# Patient Record
Sex: Female | Born: 1970 | Race: White | Hispanic: No | Marital: Single | Smoking: Current every day smoker
Health system: Southern US, Community
[De-identification: ages and names within clinical notes are randomized; demographics above are authoritative.]

## PROBLEM LIST (undated history)

## (undated) DIAGNOSIS — M797 Fibromyalgia: Secondary | ICD-10-CM

## (undated) DIAGNOSIS — K7460 Unspecified cirrhosis of liver: Secondary | ICD-10-CM

---

## 2003-07-26 ENCOUNTER — Emergency Department (HOSPITAL_COMMUNITY): Admission: EM | Admit: 2003-07-26 | Discharge: 2003-07-26 | Payer: Self-pay | Admitting: Emergency Medicine

## 2004-03-30 ENCOUNTER — Emergency Department: Payer: Self-pay | Admitting: Emergency Medicine

## 2004-11-11 ENCOUNTER — Observation Stay: Payer: Self-pay

## 2004-12-11 ENCOUNTER — Inpatient Hospital Stay: Payer: Self-pay

## 2005-03-15 ENCOUNTER — Emergency Department: Payer: Self-pay | Admitting: Emergency Medicine

## 2005-03-16 ENCOUNTER — Emergency Department: Payer: Self-pay | Admitting: Internal Medicine

## 2005-05-02 ENCOUNTER — Emergency Department: Payer: Self-pay | Admitting: Emergency Medicine

## 2005-11-03 ENCOUNTER — Emergency Department: Payer: Self-pay | Admitting: Emergency Medicine

## 2005-11-28 ENCOUNTER — Emergency Department: Payer: Self-pay | Admitting: Emergency Medicine

## 2006-02-10 ENCOUNTER — Emergency Department: Payer: Self-pay | Admitting: Emergency Medicine

## 2006-04-06 ENCOUNTER — Ambulatory Visit: Payer: Self-pay | Admitting: General Surgery

## 2007-05-31 ENCOUNTER — Emergency Department: Payer: Self-pay | Admitting: Internal Medicine

## 2008-01-10 ENCOUNTER — Emergency Department: Payer: Self-pay | Admitting: Emergency Medicine

## 2008-03-21 ENCOUNTER — Emergency Department: Payer: Self-pay | Admitting: Emergency Medicine

## 2008-05-29 ENCOUNTER — Inpatient Hospital Stay: Payer: Self-pay | Admitting: Internal Medicine

## 2008-06-17 ENCOUNTER — Ambulatory Visit: Payer: Self-pay | Admitting: Internal Medicine

## 2008-07-02 ENCOUNTER — Inpatient Hospital Stay: Payer: Self-pay | Admitting: Internal Medicine

## 2008-08-17 ENCOUNTER — Ambulatory Visit: Payer: Self-pay | Admitting: Internal Medicine

## 2008-08-24 ENCOUNTER — Inpatient Hospital Stay: Payer: Self-pay | Admitting: Internal Medicine

## 2008-11-05 ENCOUNTER — Inpatient Hospital Stay: Payer: Self-pay | Admitting: Internal Medicine

## 2008-12-11 ENCOUNTER — Emergency Department: Payer: Self-pay | Admitting: Emergency Medicine

## 2009-04-23 ENCOUNTER — Ambulatory Visit: Payer: Self-pay | Admitting: Gastroenterology

## 2009-09-04 ENCOUNTER — Emergency Department: Payer: Self-pay | Admitting: Emergency Medicine

## 2009-09-15 ENCOUNTER — Emergency Department: Payer: Self-pay | Admitting: Emergency Medicine

## 2010-11-27 ENCOUNTER — Ambulatory Visit: Payer: Self-pay

## 2012-01-06 ENCOUNTER — Ambulatory Visit: Payer: Self-pay | Admitting: Family Medicine

## 2012-03-21 ENCOUNTER — Ambulatory Visit: Payer: Self-pay | Admitting: Pain Medicine

## 2015-08-14 DIAGNOSIS — K219 Gastro-esophageal reflux disease without esophagitis: Secondary | ICD-10-CM | POA: Insufficient documentation

## 2015-08-14 DIAGNOSIS — J301 Allergic rhinitis due to pollen: Secondary | ICD-10-CM | POA: Insufficient documentation

## 2015-12-01 DIAGNOSIS — Z79891 Long term (current) use of opiate analgesic: Secondary | ICD-10-CM | POA: Insufficient documentation

## 2016-03-05 DIAGNOSIS — R7989 Other specified abnormal findings of blood chemistry: Secondary | ICD-10-CM | POA: Insufficient documentation

## 2017-08-09 ENCOUNTER — Other Ambulatory Visit: Payer: Self-pay | Admitting: Family Medicine

## 2017-08-09 DIAGNOSIS — N6452 Nipple discharge: Secondary | ICD-10-CM

## 2017-08-12 ENCOUNTER — Other Ambulatory Visit: Payer: Self-pay | Admitting: Family Medicine

## 2017-08-12 DIAGNOSIS — N6452 Nipple discharge: Secondary | ICD-10-CM

## 2017-09-19 ENCOUNTER — Ambulatory Visit
Admission: RE | Admit: 2017-09-19 | Discharge: 2017-09-19 | Disposition: A | Discharge status: Home / Self Care | Payer: Medicaid Other | Source: Ambulatory Visit | Attending: Family Medicine | Admitting: Family Medicine

## 2017-09-19 DIAGNOSIS — N6452 Nipple discharge: Secondary | ICD-10-CM

## 2018-03-03 ENCOUNTER — Other Ambulatory Visit: Payer: Self-pay

## 2018-03-03 ENCOUNTER — Emergency Department
Admission: EM | Admit: 2018-03-03 | Discharge: 2018-03-03 | Disposition: A | Discharge status: Home / Self Care | Payer: Medicaid Other | Attending: Student in an Organized Health Care Education/Training Program | Admitting: Student in an Organized Health Care Education/Training Program

## 2018-03-03 ENCOUNTER — Encounter: Payer: Self-pay | Admitting: Emergency Medicine

## 2018-03-03 DIAGNOSIS — F17210 Nicotine dependence, cigarettes, uncomplicated: Secondary | ICD-10-CM | POA: Insufficient documentation

## 2018-03-03 DIAGNOSIS — B86 Scabies: Secondary | ICD-10-CM | POA: Insufficient documentation

## 2018-03-03 DIAGNOSIS — R21 Rash and other nonspecific skin eruption: Secondary | ICD-10-CM | POA: Diagnosis present

## 2018-03-03 HISTORY — DX: Fibromyalgia: M79.7

## 2018-03-03 HISTORY — DX: Unspecified cirrhosis of liver: K74.60

## 2018-03-03 MED ORDER — HYDROXYZINE PAMOATE 50 MG PO CAPS: 50.0000 mg | ORAL_CAPSULE | Freq: Three times a day (TID) | ORAL | 0 refills | Status: DC | PRN

## 2018-03-03 MED ORDER — PERMETHRIN 5 % EX CREA: 1.0000 "application " | TOPICAL_CREAM | Freq: Once | CUTANEOUS | 0 refills | Status: AC

## 2018-03-03 MED ORDER — IVERMECTIN 3 MG PO TABS: 200.0000 ug/kg | ORAL_TABLET | ORAL | 0 refills | Status: DC

## 2018-03-03 NOTE — ED Triage Notes (Signed)
Pt to ED from home c/o rash to entire body x1-2 weeks ago.  [facility] and [facility] for "body lice" and treated with lice shampoo.  States "could see bugs coming off of me".  Pt with circular wounds, non draining.

## 2018-03-03 NOTE — ED Notes (Signed)
FIRST NURSE NOTE:  Pt seen previously at urgent care, pt dx with "body lice" and states she has been treated. Pt reports itching continues.

## 2018-03-03 NOTE — ED Notes (Signed)
Pt very upset with this RN. Pt states "they are in the scabs, I pull them out, it's not lice". This Rn confirming with pt that "nothing is crawling" on her, pt very upset over assessment. Pt states "no one is listening to me". Pt is covered in scabs, states "the bugs are coming out of the scabs". Pt more upset as assessment is progressing. No visible bugs on pt. No nits noted. Pt states she was diagnosed with body lice and treated herself four times with no improvement in symptoms.

## 2018-03-03 NOTE — ED Provider Notes (Signed)
[facility] Emergency Department Provider Note  ____________________________________________  Time seen: Approximately 7:36 PM  I have reviewed the triage vital signs and the nursing notes.   HISTORY  Chief Complaint Rash    HPI Amy Richmond is a 47 y.o. female who presents the emergency department complaining of skin infestation with either scabies or lice.  Patient reports that she first noticed symptoms 2 weeks ago, had one lesion to the scalp, multiple lesions to the interdigital space of the hand, forearms.  Patient was seen at an urgent care, diagnosed with lice.  Patient reports that she had not seen any lice but was instructed to take over-the-counter lice cream.  Patient has used this multiple times with no relief of symptoms.  Patient reports that she has not seen any visual bugs on the external aspect of her skin.  She reports that she will have tracking lesions to the forearms, legs.  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  Patient has multiple scabbed lesions to the scalp, forearms, bilateral lower extremity.  Patient is requesting medication for mite infestation which she believes is scabies versus lice.  Patient denies any systemic complaints of fevers or chills, nasal congestion, sore throat, cough.  No history of mental illness.    Past Medical History:  Diagnosis Date  . Cirrhosis of liver (HCC)   . Fibromyalgia     There are no active problems to display for this patient.   No past surgical history on file.  Prior to Admission medications   Medication Sig Start Date End Date Taking? Authorizing Provider  hydrOXYzine (VISTARIL) 50 MG capsule Take 1 capsule (50 mg total) by mouth 3 (three) times daily as needed. 03/03/18   Can Lucci, Delorise RoyalsJonathan D, PA-C  ivermectin (STROMECTOL) 3 MG TABS tablet Take 5 tablets (15,000 mcg total) by mouth every 14 (fourteen) days. Take a  dose of 5 tablets once, wait 2 weeks, take another dose of 5 tablets. 03/03/18   Anndee Connett, Delorise RoyalsJonathan D, PA-C  permethrin (ELIMITE) 5 % cream Apply 1 application topically once for 1 dose. 03/03/18 03/03/18  Sumedha Munnerlyn, Delorise RoyalsJonathan D, PA-C    Allergies Patient has no known allergies.  Family History  Problem Relation Age of Onset  . Breast cancer Mother [id number]       passed at 2658 with mets  . Breast cancer Maternal Grandmother     Social History Social History   Tobacco Use  . Smoking status: Current Every Day Smoker    Packs/day: 0.50    Types: Cigarettes  . Smokeless tobacco: Never Used  Substance Use Topics  . Alcohol use: Never    Frequency: Never  . Drug use: Never     Review of Systems  Constitutional: No fever/chills Eyes: No visual changes. No discharge ENT: No upper respiratory complaints. Cardiovascular: no chest pain. Respiratory: no cough. No SOB. Gastrointestinal: No abdominal pain.  No nausea, no vomiting.   Musculoskeletal: Negative for musculoskeletal pain. Skin: Positive for multiple skin lesions Neurological: Negative for headaches, focal weakness or numbness. 10-point ROS otherwise negative.  ____________________________________________   PHYSICAL EXAM:  VITAL SIGNS: ED Triage Vitals  Enc Vitals Group     BP 03/03/18 1903 (!) 137/92     Pulse Rate 03/03/18 1903 92     Resp 03/03/18 1903 16     Temp 03/03/18 1903 98.1 F (36.7 C)     Temp Source 03/03/18  1903 Oral     SpO2 03/03/18 1903 95 %     Weight 03/03/18 1904 160 lb (72.6 kg)     Height 03/03/18 1904 5\' 5"  (1.651 m)     Head Circumference --      Peak Flow --      Pain Score 03/03/18 1903 10     Pain Loc --      Pain Edu? --      Excl. in GC? --      Constitutional: Alert and oriented. Well appearing and in no acute distress. Eyes: Conjunctivae are normal. PERRL. EOMI. Head: Atraumatic. Neck: No stridor.    Cardiovascular: Normal rate, regular rhythm. Normal S1 and S2.  Good  peripheral circulation. Respiratory: Normal respiratory effort without tachypnea or retractions. Lungs CTAB. Good air entry to the bases with no decreased or absent breath sounds. Musculoskeletal: Full range of motion to all extremities. No gross deformities appreciated. Neurologic:  Normal speech and language. No gross focal neurologic deficits are appreciated.  Skin:  Skin is warm, dry and intact. No rash noted.  Patient with multiple lesions noted throughout upper extremities, lower extremities, left shoulder, scalp.  Patient reports "picking" at linear lesions noted to these areas.  Patient does have a few scattered linear excoriations which may be consistent with scabies at the station.  No visualized scabies.  No visualized lice.  None of scab lesions have surrounding erythema or edema concerning for cellulitic changes.  No visible abscess.  No bullae, vesicle formation. Psychiatric: Mood and affect are normal. Speech and behavior are normal. Patient exhibits appropriate insight and judgement.   ____________________________________________   LABS (all labs ordered are listed, but only abnormal results are displayed)  Labs Reviewed - No data to display ____________________________________________  EKG   ____________________________________________  RADIOLOGY   No results found.  ____________________________________________    PROCEDURES  Procedure(s) performed:    Procedures    Medications - No data to display   ____________________________________________   INITIAL IMPRESSION / ASSESSMENT AND PLAN / ED COURSE  Pertinent labs & imaging results that were available during my care of the patient were reviewed by me and considered in my medical decision making (see chart for details).  Review of the [place] CSRS was performed in accordance of the [facility] prior to dispensing any controlled drugs.      Patient's diagnosis is consistent with scabies.  Patient presents the  emergency department with linear excoriations which she has been picking at.  Patient has multiple scab lesions.  A few scattered lesions may be consistent with scabies infection.  Differential includes skin pruritus, scabies, lice, bursa, psychogenic picking.  Patient is reported symptoms, are most consistent with scabies.  No indication of lice.  Patient has no history of mental illness.  I will treat patient for scabies to see if this resolves symptoms.  If it does not, follow-up with primary care.. Patient will be discharged home with prescriptions for ivermectin, permethrin, Vistaril. Patient is to follow up with primary care as needed or otherwise directed. Patient is given ED precautions to return to the ED for any worsening or new symptoms.     ____________________________________________  FINAL CLINICAL IMPRESSION(S) / ED DIAGNOSES  Final diagnoses:  Scabies      NEW MEDICATIONS STARTED DURING THIS VISIT:  ED Discharge Orders         Ordered    ivermectin (STROMECTOL) 3 MG TABS tablet  Every 14 days     03/03/18 1940  permethrin (ELIMITE) 5 % cream   Once     03/03/18 1940    hydrOXYzine (VISTARIL) 50 MG capsule  3 times daily PRN     03/03/18 1940              This chart was dictated using voice recognition software/Dragon. Despite best efforts to proofread, errors can occur which can change the meaning. Any change was purely unintentional.    Racheal Patches, PA-C 03/03/18 1945    Willy Eddy, MD 03/03/18 1949

## 2018-03-20 ENCOUNTER — Encounter: Payer: Self-pay | Admitting: Medical Oncology

## 2018-03-20 ENCOUNTER — Emergency Department
Admission: EM | Admit: 2018-03-20 | Discharge: 2018-03-20 | Disposition: A | Discharge status: Home / Self Care | Payer: Medicaid Other | Attending: Emergency Medicine | Admitting: Emergency Medicine

## 2018-03-20 DIAGNOSIS — B86 Scabies: Secondary | ICD-10-CM | POA: Diagnosis not present

## 2018-03-20 DIAGNOSIS — R21 Rash and other nonspecific skin eruption: Secondary | ICD-10-CM

## 2018-03-20 DIAGNOSIS — B852 Pediculosis, unspecified: Secondary | ICD-10-CM | POA: Diagnosis not present

## 2018-03-20 DIAGNOSIS — F17210 Nicotine dependence, cigarettes, uncomplicated: Secondary | ICD-10-CM | POA: Insufficient documentation

## 2018-03-20 MED ORDER — SULFAMETHOXAZOLE-TRIMETHOPRIM 800-160 MG PO TABS: 1.0000 | ORAL_TABLET | Freq: Two times a day (BID) | ORAL | 0 refills | Status: DC

## 2018-03-20 MED ORDER — HYDROXYZINE PAMOATE 50 MG PO CAPS: 50.0000 mg | ORAL_CAPSULE | Freq: Three times a day (TID) | ORAL | 0 refills | Status: DC | PRN

## 2018-03-20 NOTE — ED Provider Notes (Signed)
[facility] Emergency Department Provider Note   ____________________________________________   First MD Initiated Contact with Patient 03/20/18 1021     (approximate)  I have reviewed the triage vital signs and the nursing notes.   HISTORY  Chief Complaint Rash    HPI Amy Richmond is a 47 y.o. female patient presents for reevaluation of a rash on her upper extremities.  Patient has been diagnosed with lice and scabies and has not responded to medications.  Multiple lesions on the upper and lower extremities some which are scabbed over and some active stage resulting in intense itching.  Patient admits to repeatedly picking at the lesions due to itching.  Patient has no history of Richmond-mutilation or other mental illness.  Rates her discomfort as 9/10.  Past Medical History:  Diagnosis Date  . Cirrhosis of liver (HCC)   . Fibromyalgia     There are no active problems to display for this patient.   No past surgical history on file.  Prior to Admission medications   Medication Sig Start Date End Date Taking? Authorizing Provider  hydrOXYzine (VISTARIL) 50 MG capsule Take 1 capsule (50 mg total) by mouth 3 (three) times daily as needed. 03/20/18   Joni ReiningSmith, Durante Violett K, PA-C  ivermectin (STROMECTOL) 3 MG TABS tablet Take 5 tablets (15,000 mcg total) by mouth every 14 (fourteen) days. Take a dose of 5 tablets once, wait 2 weeks, take another dose of 5 tablets. 03/03/18   Cuthriell, Delorise RoyalsJonathan D, PA-C  sulfamethoxazole-trimethoprim (BACTRIM DS,SEPTRA DS) 800-160 MG tablet Take 1 tablet by mouth 2 (two) times daily. 03/20/18   Joni ReiningSmith, Aryani Daffern K, PA-C    Allergies Patient has no known allergies.  Family History  Problem Relation Age of Onset  . Breast cancer Mother [id number]       passed at 4158 with mets  . Breast cancer Maternal Grandmother     Social History Social History   Tobacco Use  . Smoking status: Current Every Day Smoker    Packs/day: 0.50    Types:  Cigarettes  . Smokeless tobacco: Never Used  Substance Use Topics  . Alcohol use: Never    Frequency: Never  . Drug use: Never    Review of Systems Constitutional: No fever/chills Eyes: No visual changes. ENT: No sore throat. Cardiovascular: Denies chest pain. Respiratory: Denies shortness of breath. Gastrointestinal: No abdominal pain.  No nausea, no vomiting.  No diarrhea.  No constipation. Genitourinary: Negative for dysuria. Musculoskeletal: Negative for back pain. Skin: Positive for rash. Neurological: Negative for headaches, focal weakness or numbness. Endocrine:Liver cirrhosis.   ____________________________________________   PHYSICAL EXAM:  VITAL SIGNS: ED Triage Vitals [03/20/18 1009]  Enc Vitals Group     BP (!) 146/98     Pulse Rate 77     Resp 16     Temp 98.2 F (36.8 C)     Temp Source Oral     SpO2 97 %     Weight 158 lb 11.7 oz (72 kg)     Height 5\' 5"  (1.651 m)     Head Circumference      Peak Flow      Pain Score 9     Pain Loc      Pain Edu?      Excl. in GC?    Constitutional: Alert and oriented. Well appearing and in no acute distress. Cardiovascular: Normal rate, regular rhythm. Grossly normal heart sounds.  Good peripheral circulation. Respiratory: Normal respiratory effort.  No retractions. Lungs CTAB. Gastrointestinal: Soft and nontender. No distention. No abdominal bruits. No CVA tenderness. Neurologic:  Normal speech and language. No gross focal neurologic deficits are appreciated. No gait instability. Skin: Multiple macular lesion on upper and lower extremity.  There is signs excoriation and mild erythema. Marland Kitchen Psychiatric: Mood and affect are normal. Speech and behavior are normal.  ____________________________________________   LABS (all labs ordered are listed, but only abnormal results are displayed)  Labs Reviewed - No data to  display ____________________________________________  EKG   ____________________________________________  RADIOLOGY  ED MD interpretation:    Official radiology report(s): No results found.  ____________________________________________   PROCEDURES  Procedure(s) performed: None  Procedures  Critical Care performed: No  ____________________________________________   INITIAL IMPRESSION / ASSESSMENT AND PLAN / ED COURSE  As part of my medical decision making, I reviewed the following data within the electronic MEDICAL RECORD NUMBER    Patient presents with rash to the upper and lower extremities greater than 1 month.  Rest refractory to treatment for scabies.  Patient requested and was given consult for dermatology for definitive evaluation and treatment.  Patient advised to take Atarax and Bactrim DS while waiting for dermatology consult.      ____________________________________________   FINAL CLINICAL IMPRESSION(S) / ED DIAGNOSES  Final diagnoses:  Rash and nonspecific skin eruption     ED Discharge Orders         Ordered    hydrOXYzine (VISTARIL) 50 MG capsule  3 times daily PRN     03/20/18 1032    sulfamethoxazole-trimethoprim (BACTRIM DS,SEPTRA DS) 800-160 MG tablet  2 times daily     03/20/18 1032           Note:  This document was prepared using Dragon voice recognition software and may include unintentional dictation errors.    Joni Reining, PA-C 03/20/18 1041    Emily Filbert, MD 03/20/18 [phone]

## 2018-03-20 NOTE — Discharge Instructions (Signed)
Your rash has not improved status post treatment for suspected scabies.  Advised definitive evaluation and treatment by dermatologist.  Continue taking Atarax for itching.  Start Bactrim DS for secondary infection.

## 2018-03-20 NOTE — ED Triage Notes (Signed)
Pt reports that she was seen here a couple weeks ago for scabies, pt reports she has used prescribed meds and rash is not improving.

## 2018-04-02 ENCOUNTER — Encounter: Payer: Self-pay | Admitting: Emergency Medicine

## 2018-04-02 ENCOUNTER — Other Ambulatory Visit: Payer: Self-pay

## 2018-04-02 DIAGNOSIS — F17210 Nicotine dependence, cigarettes, uncomplicated: Secondary | ICD-10-CM | POA: Insufficient documentation

## 2018-04-02 DIAGNOSIS — Z79899 Other long term (current) drug therapy: Secondary | ICD-10-CM | POA: Diagnosis not present

## 2018-04-02 DIAGNOSIS — R21 Rash and other nonspecific skin eruption: Secondary | ICD-10-CM | POA: Diagnosis not present

## 2018-04-02 DIAGNOSIS — R079 Chest pain, unspecified: Secondary | ICD-10-CM | POA: Insufficient documentation

## 2018-04-02 DIAGNOSIS — K5900 Constipation, unspecified: Secondary | ICD-10-CM | POA: Insufficient documentation

## 2018-04-02 LAB — COMPREHENSIVE METABOLIC PANEL
ALBUMIN: 4.2 g/dL (ref 3.5–5.0)
ALT: 33 U/L (ref 0–44)
AST: 19 U/L (ref 15–41)
Alkaline Phosphatase: 119 U/L (ref 38–126)
Anion gap: 7 (ref 5–15)
BUN: 10 mg/dL (ref 6–20)
CO2: 26 mmol/L (ref 22–32)
CREATININE: 0.82 mg/dL (ref 0.44–1.00)
Calcium: 9.4 mg/dL (ref 8.9–10.3)
Chloride: 108 mmol/L (ref 98–111)
GFR calc Af Amer: >60 mL/min (ref >60)
GFR calc non Af Amer: >60 mL/min (ref >60)
GLUCOSE: 108 mg/dL — AB (ref 70–99)
Potassium: 3.6 mmol/L (ref 3.5–5.1)
SODIUM: 141 mmol/L (ref 135–145)
Total Bilirubin: 0.2 mg/dL — ABNORMAL LOW (ref 0.3–1.2)
Total Protein: 7.1 g/dL (ref 6.5–8.1)

## 2018-04-02 LAB — CBC
HEMATOCRIT: 40.1 % (ref 36.0–46.0)
Hemoglobin: 13.4 g/dL (ref 12.0–15.0)
MCH: 31.1 pg (ref 26.0–34.0)
MCHC: 33.4 g/dL (ref 30.0–36.0)
MCV: 93 fL (ref 80.0–100.0)
NRBC: 0 % (ref 0.0–0.2)
Platelets: 530 10*3/uL — ABNORMAL HIGH (ref 150–400)
RBC: 4.31 MIL/uL (ref 3.87–5.11)
RDW: 13.3 % (ref 11.5–15.5)
WBC: 7.6 10*3/uL (ref 4.0–10.5)

## 2018-04-02 LAB — LIPASE, BLOOD: Lipase: 25 U/L (ref 11–51)

## 2018-04-02 NOTE — ED Triage Notes (Signed)
Pt here tonight with c/o left upper chest pain that radiates down her left arm; pt says the pain started last night; pt adds she's passing worms in her stool and "spitting them out of my mouth"; pt reports abd swelling; pt says she's constipated and "can't even use the bathroom"; pt has been here twice in the past 2 months and diagnosed with "scabies mites"; pt says she did the treatment twice and threw everything away;

## 2018-04-03 ENCOUNTER — Encounter: Payer: Self-pay | Admitting: Radiology

## 2018-04-03 ENCOUNTER — Emergency Department
Admission: EM | Admit: 2018-04-03 | Discharge: 2018-04-03 | Disposition: A | Discharge status: Home / Self Care | Payer: Medicaid Other | Attending: Emergency Medicine | Admitting: Emergency Medicine

## 2018-04-03 ENCOUNTER — Emergency Department: Payer: Medicaid Other

## 2018-04-03 DIAGNOSIS — R21 Rash and other nonspecific skin eruption: Secondary | ICD-10-CM

## 2018-04-03 MED ORDER — POLYETHYLENE GLYCOL 3350 17 G PO PACK
PACK | ORAL | Status: AC
  Filled 2018-04-03: qty 1

## 2018-04-03 MED ORDER — POLYETHYLENE GLYCOL 3350 17 G PO PACK
17.0000 g | PACK | Freq: Every day | ORAL | Status: DC
  Administered 2018-04-03: 17 g via ORAL

## 2018-04-03 MED ORDER — IOPAMIDOL (ISOVUE-300) INJECTION 61%
100.0000 mL | Freq: Once | INTRAVENOUS | Status: AC | PRN
  Administered 2018-04-03: 100 mL via INTRAVENOUS

## 2018-04-03 NOTE — ED Notes (Signed)
Dr. Brown at bedside

## 2018-04-03 NOTE — ED Provider Notes (Signed)
[facility] Emergency Department Provider Note _____________   First MD Initiated Contact with Patient 04/03/18 0120     (approximate)  I have reviewed the triage vital signs and the nursing notes.   HISTORY  Chief Complaint Chest Pain and Abdominal Pain  HPI Amy Richmond is a 47 y.o. female with below list of chronic medical conditions presents to the emergency department with multiple medical complaints including patient states that she is "passing worms in my stool and spitting them out of my mouth".  Patient then states that the worms in her stool are "the little white ones".  Patient states the things coming from her mouth are actually not worms but rather difficult to describe.  Patient also states that she has "mites" that she has pulled from her skin".  Patient also admits to a 5-day history of constipation.  Patient denies any fever.  Patient also admits to abdominal distention which began yesterday.  Patient also admits to chest pain.  Patient has been seen in the emergency department twice for possible scabies in the last 2 months and stated that she completed a treatment twice however she continues to "pull mites" from her skin.  Review of the patient's chart revealed that she was evaluated on 03/20/2018 for the same and referred to dermatology which the patient states that she has an appointment in February.  Of note patient denies any recent travel outside of the [place].   Past Medical History:  Diagnosis Date  . Cirrhosis of liver (HCC)   . Fibromyalgia     There are no active problems to display for this patient.    Prior to Admission medications   Medication Sig Start Date End Date Taking? Authorizing Provider  ADDERALL XR 20 MG 24 hr capsule Take 20 mg by mouth daily. 03/07/18  Yes [provider]  amphetamine-dextroamphetamine (ADDERALL) 20 MG tablet Take 20 mg by mouth daily at 2 PM. 03/07/18  Yes [provider]    buprenorphine-naloxone (SUBOXONE) 8-2 mg SUBL SL tablet Place 1 tablet under the tongue 3 (three) times daily.   Yes [provider]  DULoxetine (CYMBALTA) 30 MG capsule Take 60 mg by mouth daily. 03/07/18  Yes [provider]  fluticasone (FLONASE) 50 MCG/ACT nasal spray Place 2 sprays into both nostrils daily. 01/25/18  Yes [provider]  gabapentin (NEURONTIN) 800 MG tablet Take 800 mg by mouth 4 (four) times daily.   Yes [provider]  hydrOXYzine (VISTARIL) 50 MG capsule Take 1 capsule (50 mg total) by mouth 3 (three) times daily as needed. 03/20/18  Yes Joni Reining, PA-C  mirtazapine (REMERON) 30 MG tablet Take 30 mg by mouth at bedtime. 03/07/18  Yes [provider]  ondansetron (ZOFRAN) 4 MG tablet Take 4 mg by mouth every 6 (six) hours as needed for nausea. 02/22/18  Yes [provider]  PROAIR HFA 108 (90 Base) MCG/ACT inhaler Inhale 2 puffs into the lungs every 6 (six) hours as needed for wheezing. 01/25/18  Yes [provider]  promethazine (PHENERGAN) 25 MG tablet Take 25 mg by mouth every 6 (six) hours as needed for nausea.  01/25/18  Yes [provider]  QUEtiapine (SEROQUEL) 25 MG tablet Take 25 mg by mouth at bedtime. 03/07/18  Yes [provider]  SUBOXONE 8-2 MG FILM Place 3 Film under the tongue daily. 03/01/18  Yes [provider]  topiramate (TOPAMAX) 100 MG tablet Take 200 mg by mouth  2 (two) times daily. 03/07/18  Yes [provider]  ivermectin (STROMECTOL) 3 MG TABS tablet Take 5 tablets (15,000 mcg total) by mouth every 14 (fourteen) days. Take a dose of 5 tablets once, wait 2 weeks, take another dose of 5 tablets. Patient not taking: Reported on 04/03/2018 03/03/18   Cuthriell, Delorise Royals, PA-C    Allergies Tylenol [acetaminophen]  Family History  Problem Relation Age of Onset  . Breast cancer Mother 37       passed at 50 with mets  . Breast cancer Maternal  Grandmother     Social History Social History   Tobacco Use  . Smoking status: Current Every Day Smoker    Packs/day: 0.50    Types: Cigarettes  . Smokeless tobacco: Never Used  Substance Use Topics  . Alcohol use: Not Currently    Frequency: Never  . Drug use: Never    Review of Systems Constitutional: No fever/chills Eyes: No visual changes. ENT: No sore throat. Cardiovascular: Positive for chest pain. Respiratory: Denies shortness of breath. Gastrointestinal: Positive abdominal pain and distention.  No nausea, no vomiting.  No diarrhea.  No constipation. Genitourinary: Negative for dysuria. Musculoskeletal: Negative for neck pain.  Negative for back pain. Integumentary: Positive for rash. Neurological: Negative for headaches, focal weakness or numbness.   ____________________________________________   PHYSICAL EXAM:  VITAL SIGNS: ED Triage Vitals  Enc Vitals Group     BP 04/02/18 2251 (!) 129/103     Pulse Rate 04/02/18 2251 85     Resp 04/02/18 2251 17     Temp 04/02/18 2251 98.5 F (36.9 C)     Temp Source 04/02/18 2251 Oral     SpO2 04/02/18 2251 97 %     Weight 04/02/18 2253 72 kg (158 lb 11.7 oz)     Height 04/02/18 2253 1.651 m (5\' 5" )     Head Circumference --      Peak Flow --      Pain Score 04/02/18 2252 10     Pain Loc --      Pain Edu? --      Excl. in GC? --     Constitutional: Alert and oriented. Well appearing and in no acute distress. Eyes: Conjunctivae are normal.  Mouth/Throat: Mucous membranes are moist.{**  Oropharynx non-erythematous. Neck: No stridor.  Cardiovascular: Normal rate, regular rhythm. Good peripheral circulation. Grossly normal heart sounds. Respiratory: Normal respiratory effort.  No retractions. Lungs CTAB. Gastrointestinal: Soft and nontender. No distention.  Musculoskeletal: No lower extremity tenderness nor edema. No gross deformities of extremities. Neurologic:  Normal speech and language. No gross focal  neurologic deficits are appreciated.  Skin:  Skin is warm, dry and intact. No rash noted. Psychiatric: Mood and affect are normal. Speech and behavior are normal.  ____________________________________________   LABS (all labs ordered are listed, but only abnormal results are displayed)  Labs Reviewed  COMPREHENSIVE METABOLIC PANEL - Abnormal; Notable for the following components:      Result Value   Glucose, Bld 108 (*)    Total Bilirubin 0.2 (*)    All other components within normal limits  CBC - Abnormal; Notable for the following components:   Platelets 530 (*)    All other components within normal limits  OVA + PARASITE EXAM  GASTROINTESTINAL PANEL BY PCR, STOOL (REPLACES STOOL CULTURE)  LIPASE, BLOOD   ____________________________________________  EKG  ED ECG REPORT I,  N BROWN, the attending physician, personally viewed and interpreted this ECG.  Date: 04/02/2018  EKG Time: 22:44 PM  Rate: 83  Rhythm: Normal sinus rhythm  Axis: Normal  Intervals: Normal  ST&T Change: None  ____________________________________________  RADIOLOGY I, [place] N BROWN, personally viewed and evaluated these images (plain radiographs) as part of my medical decision making, as well as reviewing the written report by the radiologist.  ED MD interpretation: CT abdomen revealed stool throughout the colon no other acute intra-abdominal pathology.  Official radiology report(s): Ct Abdomen Pelvis W Contrast  Result Date: 04/03/2018 CLINICAL DATA:  Chest and abdominal pain for the past month. EXAM: CT ABDOMEN AND PELVIS WITH CONTRAST TECHNIQUE: Multidetector CT imaging of the abdomen and pelvis was performed using the standard protocol following bolus administration of intravenous contrast. CONTRAST:  100mL ISOVUE-300 IOPAMIDOL (ISOVUE-300) INJECTION 61% COMPARISON:  CT abdomen pelvis dated Aug 24, 2008. FINDINGS: Lower chest: No acute abnormality.  Mild centrilobular emphysema.  Hepatobiliary: No focal liver abnormality. Status post cholecystectomy. Unchanged mild common bile duct and central intrahepatic biliary dilatation, likely due to post cholecystectomy state. Pancreas: Unremarkable. Mildly prominent main pancreatic duct is unchanged. No surrounding inflammatory changes. Spleen: Normal in size without focal abnormality. Adrenals/Urinary Tract: The adrenal glands are unremarkable. Unchanged right renal cortical scarring. No renal mass or calculus. No hydronephrosis. The bladder is unremarkable. Stomach/Bowel: Stomach is within normal limits. Appendix appears normal. No evidence of bowel wall thickening, distention, or inflammatory changes. Mild-to-moderate increased colonic stool burden. Vascular/Lymphatic: Aortic atherosclerosis. No enlarged abdominal or pelvic lymph nodes. Reproductive: Uterus and bilateral adnexa are unremarkable. Nabothian cyst. Other: No abdominal wall hernia or abnormality. No abdominopelvic ascites. No pneumoperitoneum. Musculoskeletal: No acute or significant osseous findings. IMPRESSION: 1.  No acute intra-abdominal process. 2.  Prominent stool throughout the colon favors constipation. 3.  Emphysema (ICD10-J43.9). 4.  Aortic atherosclerosis (ICD10-I70.0). Electronically Signed   By: Obie DredgeWilliam T Derry M.D.   On: 04/03/2018 02:48     Procedures   ____________________________________________   INITIAL IMPRESSION / ASSESSMENT AND PLAN / ED COURSE  As part of my medical decision making, I reviewed the following data within the electronic MEDICAL RECORD NUMBER  47 year old female presenting with above-stated history and physical exam.  Consider the possibility of helminth/parasitic infection and as such stool ova and parasites ordered however patient unable to give a stool sample while in the emergency department.  Also considered possibility of formication and as such urine was ordered for analysis however patient states that she was unable to urinate while  in the emergency department.  I instructed the patient to follow-up with a primary care provider to have a stool sample obtained and evaluated.  Regarding the patient's chest pain EKG revealed no evidence of ischemia or infarction.  Laboratory data unremarkable (UDS not obtained).  Regarding patient's abdominal pain and constipation patient given MiraLAX in the emergency department.  CT scan revealed no acute abnormality in the abdomen. ____________________________________________  FINAL CLINICAL IMPRESSION(S) / ED DIAGNOSES  Final diagnoses:  Rash and nonspecific skin eruption  Constipation   MEDICATIONS GIVEN DURING THIS VISIT:  Medications  iopamidol (ISOVUE-300) 61 % injection 100 mL (100 mLs Intravenous Contrast Given 04/03/18 0225)     ED Discharge Orders    None       Note:  This document was prepared using Dragon voice recognition software and may include unintentional dictation errors.    Darci CurrentBrown, [place] N, MD 04/03/18 2240

## 2018-04-03 NOTE — ED Notes (Signed)
Pt reports chest and abdominal pain x 1 month. Pt states that she has parasites and scabies. Brought parasite with her in a bag. Pt states constipation x 9 days. Family at bedside.

## 2018-08-31 ENCOUNTER — Ambulatory Visit: Payer: Self-pay | Admitting: Physician Assistant

## 2018-10-12 ENCOUNTER — Other Ambulatory Visit: Payer: Self-pay

## 2018-10-12 ENCOUNTER — Encounter: Payer: Self-pay | Admitting: Emergency Medicine

## 2018-10-12 ENCOUNTER — Emergency Department
Admission: EM | Admit: 2018-10-12 | Discharge: 2018-10-12 | Disposition: A | Discharge status: Home / Self Care | Payer: Medicaid Other | Attending: Emergency Medicine | Admitting: Emergency Medicine

## 2018-10-12 DIAGNOSIS — F17210 Nicotine dependence, cigarettes, uncomplicated: Secondary | ICD-10-CM | POA: Insufficient documentation

## 2018-10-12 DIAGNOSIS — K7460 Unspecified cirrhosis of liver: Secondary | ICD-10-CM | POA: Diagnosis not present

## 2018-10-12 DIAGNOSIS — R1010 Upper abdominal pain, unspecified: Secondary | ICD-10-CM | POA: Insufficient documentation

## 2018-10-12 LAB — CBC WITH DIFFERENTIAL/PLATELET
Abs Immature Granulocytes: 0.02 10*3/uL (ref 0.00–0.07)
Basophils Absolute: 0 10*3/uL (ref 0.0–0.1)
Basophils Relative: 0 %
Eosinophils Absolute: 0.1 10*3/uL (ref 0.0–0.5)
Eosinophils Relative: 2 %
HCT: 40.4 % (ref 36.0–46.0)
Hemoglobin: 13.4 g/dL (ref 12.0–15.0)
Immature Granulocytes: 0 %
Lymphocytes Relative: 44 %
Lymphs Abs: 3.2 10*3/uL (ref 0.7–4.0)
MCH: 30.6 pg (ref 26.0–34.0)
MCHC: 33.2 g/dL (ref 30.0–36.0)
MCV: 92.2 fL (ref 80.0–100.0)
Monocytes Absolute: 0.6 10*3/uL (ref 0.1–1.0)
Monocytes Relative: 8 %
Neutro Abs: 3.2 10*3/uL (ref 1.7–7.7)
Neutrophils Relative %: 46 %
Platelets: 389 10*3/uL (ref 150–400)
RBC: 4.38 MIL/uL (ref 3.87–5.11)
RDW: 12.8 % (ref 11.5–15.5)
WBC: 7.2 10*3/uL (ref 4.0–10.5)
nRBC: 0 % (ref 0.0–0.2)

## 2018-10-12 LAB — COMPREHENSIVE METABOLIC PANEL
ALT: 31 U/L (ref 0–44)
AST: 25 U/L (ref 15–41)
Albumin: 4.1 g/dL (ref 3.5–5.0)
Alkaline Phosphatase: 98 U/L (ref 38–126)
Anion gap: 10 (ref 5–15)
BUN: 15 mg/dL (ref 6–20)
CO2: 24 mmol/L (ref 22–32)
Calcium: 9.1 mg/dL (ref 8.9–10.3)
Chloride: 108 mmol/L (ref 98–111)
Creatinine, Ser: 0.75 mg/dL (ref 0.44–1.00)
GFR calc Af Amer: >60 mL/min (ref >60)
GFR calc non Af Amer: >60 mL/min (ref >60)
Glucose, Bld: 130 mg/dL — ABNORMAL HIGH (ref 70–99)
Potassium: 3.7 mmol/L (ref 3.5–5.1)
Sodium: 142 mmol/L (ref 135–145)
Total Bilirubin: 0.3 mg/dL (ref 0.3–1.2)
Total Protein: 7.1 g/dL (ref 6.5–8.1)

## 2018-10-12 LAB — LIPASE, BLOOD: Lipase: 22 U/L (ref 11–51)

## 2018-10-12 NOTE — ED Notes (Signed)
Accompanied Dr Jimmye Norman to pt room to review lab results - Dr Jimmye Norman started to explain to pt that her labs were normal and she became agitated and started talking about the fact that she was a recovering alcoholic and that doctors would not treat her because of that - she stated that she was leaving and Dr Jimmye Norman attempted to talk to pt about labs and was going to look at a stool sample she had brought - pt started showing pictures of stool that were on her phone and talking about the "mites" that were in it - no "mites" were visualized by this nurse or the provider - pt then ask if she was done and stated that she was leaving - Dr Jimmye Norman told the pt that they were done if she wanted to leave and pt then demanded that monitoring equipment be removed and IV taken out - pt then requested name of provider and stated that she was keeping track of all the physicians that would not treat her and that she was taking them to her attorney

## 2018-10-12 NOTE — ED Triage Notes (Signed)
Says she has upper abdominal pain and has a hisory of cirrhosis.  She also says she has "mites coming out in stool and spitting them up.  Says she can see the rib cageso f the mites they are so big.

## 2018-10-12 NOTE — ED Notes (Signed)
Per pt she saw her psychiatrist yesterday and her liver enzymes were elevated.

## 2018-10-12 NOTE — ED Notes (Signed)
RN at bedside to discharge pt. Pt requesting to see dr Jimmye Norman again.  Informed dr Jimmye Norman patient would like to speak with him.  Patient raising voice towards MD showing what she coughed up on a paper towel; no visible moving organism to RN in sputum.  Pt remains upset and packing up stuff. Unable to obtain any further vital signs or have patient sign discharge paper work.

## 2018-10-12 NOTE — ED Provider Notes (Signed)
[facility] Emergency Department Provider Note       Time seen: ----------------------------------------- 7:53 AM on 10/12/2018 -----------------------------------------   I have reviewed the triage vital signs and the nursing notes.  HISTORY   Chief Complaint Abdominal Pain    HPI Amy Richmond is a 48 y.o. female with a history of cirrhosis, fibromyalgia who presents to the ED for upper abdominal pain.  Her main complaint is bugs are crawling and coming out of her skin, and her sputum, but neither toenails.  She states she can see the outline of these in her skin, she thinks they are mites.  She has been dealing with this for 8 months she states.  She brought in a sample of her own stool for me to evaluate.  Past Medical History:  Diagnosis Date  . Cirrhosis of liver (HCC)   . Fibromyalgia     There are no active problems to display for this patient.   Past Surgical History:  Procedure Laterality Date  . TUBAL LIGATION      Allergies Tylenol [acetaminophen]  Social History Social History   Tobacco Use  . Smoking status: Current Every Day Smoker    Packs/day: 0.50    Types: Cigarettes  . Smokeless tobacco: Never Used  Substance Use Topics  . Alcohol use: Not Currently    Frequency: Never  . Drug use: Never   Review of Systems Constitutional: Negative for fever. Cardiovascular: Negative for chest pain. Respiratory: Negative for shortness of breath. Gastrointestinal: Positive for abdominal pain Musculoskeletal: Negative for back pain. Skin: Negative for rash. Neurological: Negative for headaches, focal weakness or numbness.  All systems negative/normal/unremarkable except as stated in the HPI  ____________________________________________   PHYSICAL EXAM:  VITAL SIGNS: ED Triage Vitals [10/12/18 0743]  Enc Vitals Group     BP (!) 151/90     Pulse Rate (!) 101     Resp 16     Temp 99.1 F (37.3 C)     Temp Source Oral      SpO2 96 %     Weight      Height      Head Circumference      Peak Flow      Pain Score 10     Pain Loc      Pain Edu?      Excl. in GC?    Constitutional: Alert and oriented. Well appearing and in no distress. Eyes: Conjunctivae are normal. Normal extraocular movements. Cardiovascular: Normal rate, regular rhythm. No murmurs, rubs, or gallops. Respiratory: Normal respiratory effort without tachypnea nor retractions. Breath sounds are clear and equal bilaterally. No wheezes/rales/rhonchi. Gastrointestinal: Soft and nontender. Normal bowel sounds Musculoskeletal: Nontender with normal range of motion in extremities. No lower extremity tenderness nor edema. Neurologic:  Normal speech and language. No gross focal neurologic deficits are appreciated.  Skin: Multiple areas of new and old skin excoriation, no identifiable insects are appreciated Psychiatric: Mood and affect are normal.  ____________________________________________  EKG: Interpreted by me.  Sinus tachycardia with a rate of 94 bpm, normal axis, low voltage, artifact, normal QT  ____________________________________________  ED COURSE:  As part of my medical decision making, I reviewed the following data within the electronic MEDICAL RECORD NUMBER History obtained from family if available, nursing notes, old chart and ekg, as well as notes from prior ED visits. Patient presented for abdominal pain, we will assess with labs and imaging as indicated at this time.   Procedures  Amy CorporationChristy  L Richmond was evaluated in Emergency Department on 10/12/2018 for the symptoms described in the history of present illness. She was evaluated in the context of the global COVID-19 pandemic, which necessitated consideration that the patient might be at risk for infection with the SARS-CoV-2 virus that causes COVID-19. Institutional protocols and algorithms that pertain to the evaluation of patients at risk for COVID-19 are in a state of rapid change based  on information released by regulatory bodies including the CDC and federal and state organizations. These policies and algorithms were followed during the patient's care in the ED.  ____________________________________________   LABS (pertinent positives/negatives)  Labs Reviewed  COMPREHENSIVE METABOLIC PANEL - Abnormal; Notable for the following components:      Result Value   Glucose, Bld 130 (*)    All other components within normal limits  CBC WITH DIFFERENTIAL/PLATELET  LIPASE, BLOOD   ____________________________________________   DIFFERENTIAL DIAGNOSIS   Cirrhosis, pancreatitis, alcohol abuse, fictitious disorder, formication  FINAL ASSESSMENT AND PLAN  Abdominal pain   Plan: The patient had presented for abdominal pain and formication. Patient's labs were reassuring.  Patient showed me extensive pictures of her stool in which I did not see any insects or parasites.  I did not confront her or demeaning her, alcohol use was not brought up to her.  When I entered the room to give her her test results she was demanding to leave and very hostile.  She is threatening to sue over lack of closure regarding seeing bugs in her skin, stool and sputum.   Laurence Aly, MD    Note: This note was generated in part or whole with voice recognition software. Voice recognition is usually quite accurate but there are transcription errors that can and very often do occur. I apologize for any typographical errors that were not detected and corrected.     Earleen Newport, MD 10/12/18 [phone]

## 2019-04-18 IMAGING — CT CT ABD-PELV W/ CM
2 of 5 series · 17 of 46 positions shown, 19 images · IV contrast (iopamidol)
Comparison: CT abdomen pelvis dated August 24, 2008.

CLINICAL DATA: Chest and abdominal pain for the past month.

EXAM:
CT ABDOMEN AND PELVIS WITH CONTRAST
TECHNIQUE: Multidetector CT imaging of the abdomen and pelvis was performed
using the standard protocol following bolus administration of
intravenous contrast.
CONTRAST:  100mL 1HNNE9-XTT IOPAMIDOL (1HNNE9-XTT) INJECTION 61%

[Series 2: routine abd/pel with · axial · 0.76mm/px · z∈[-1074,-684]mm · 14 of 88 slices shown, 16 images]
[im 5/88  soft-tissue]
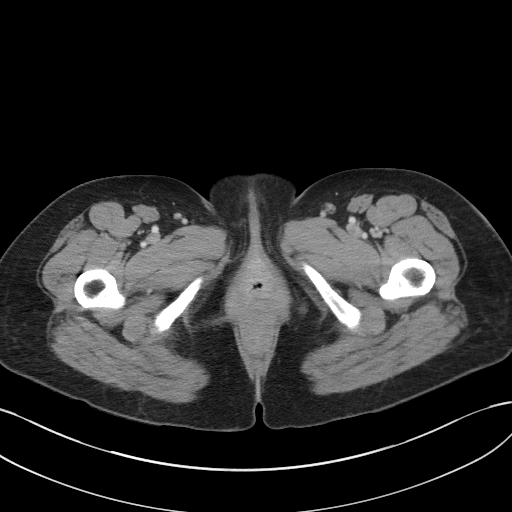
[im 5/88  bone]
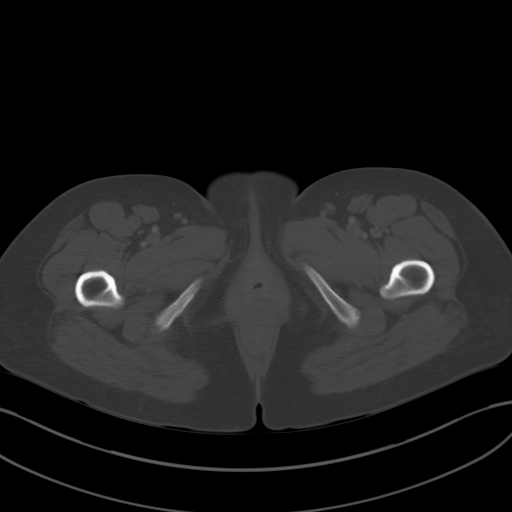
[im 10/88  soft-tissue]
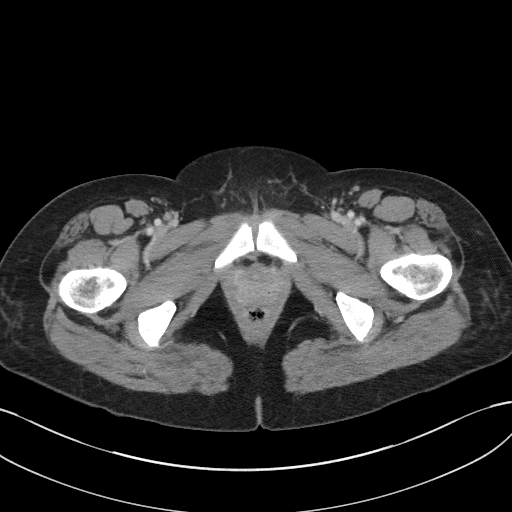
[im 20/88  soft-tissue]
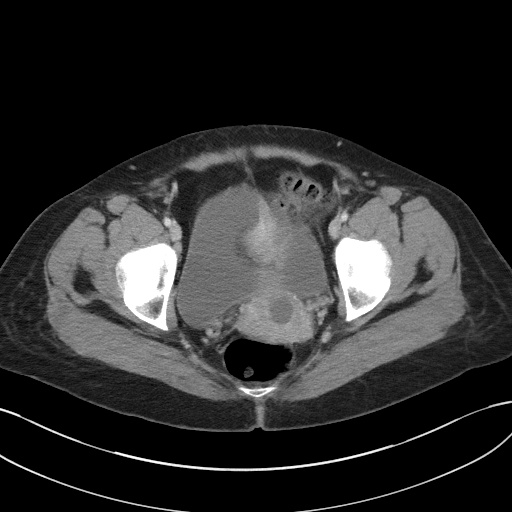
[im 25/88  soft-tissue]
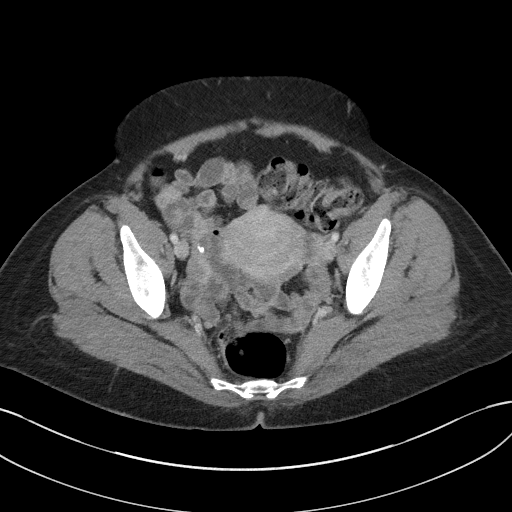
[im 30/88  soft-tissue]
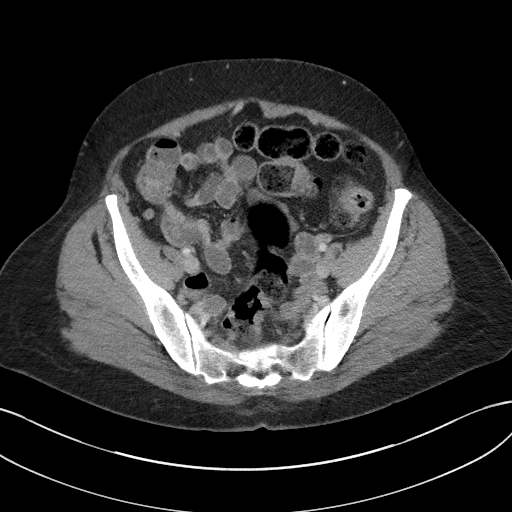
[im 34/88  soft-tissue]
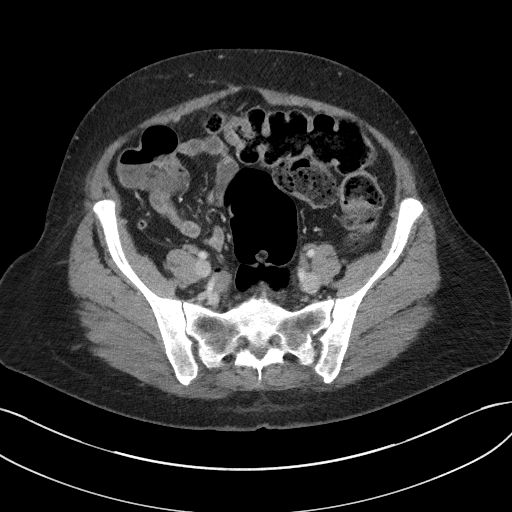
[im 39/88  soft-tissue]
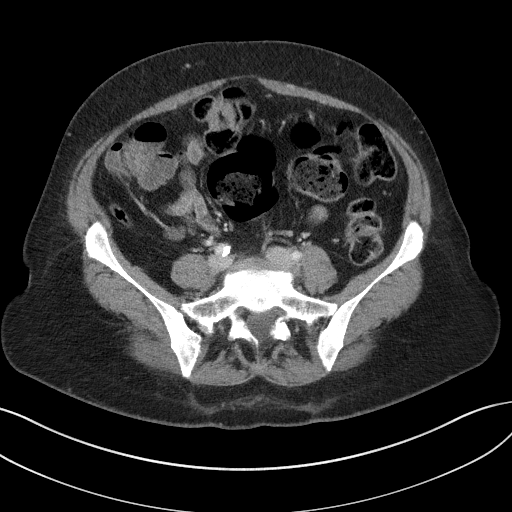
[im 49/88  soft-tissue]
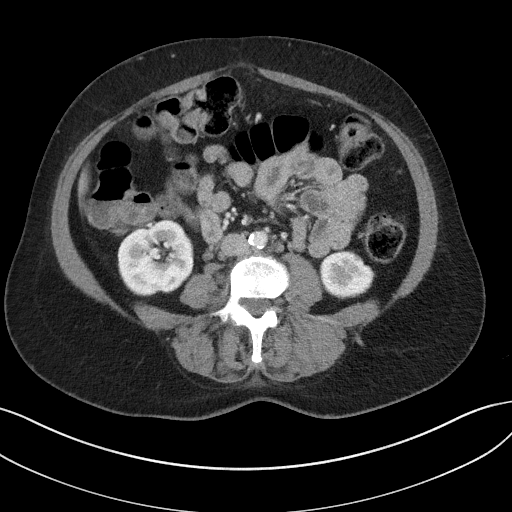
[im 54/88  soft-tissue]
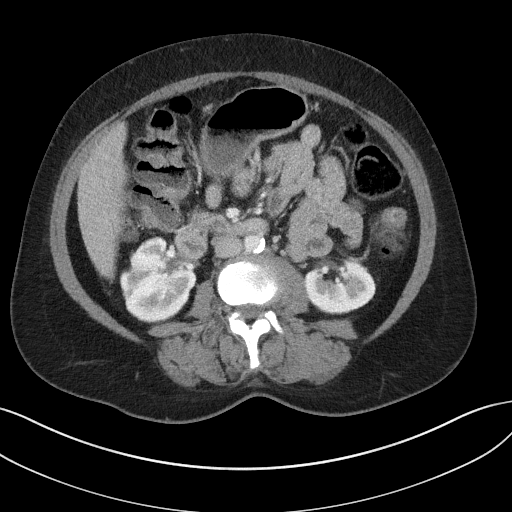
[im 54/88  bone]
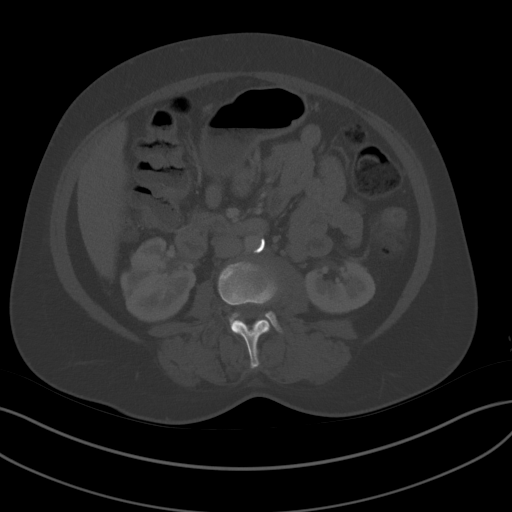
[im 59/88  soft-tissue]
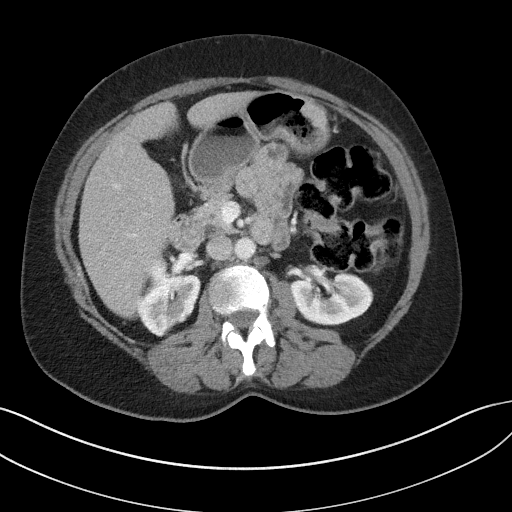
[im 63/88  soft-tissue]
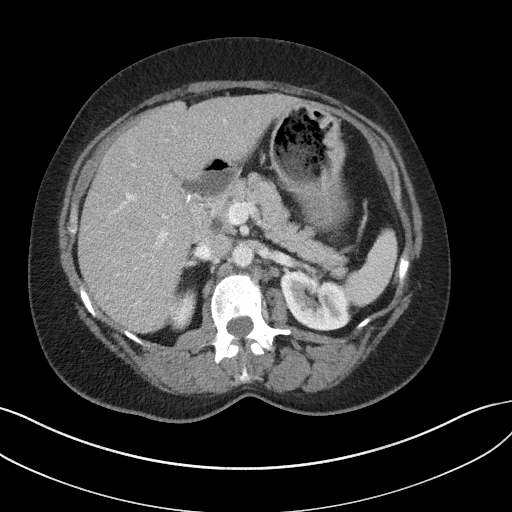
[im 68/88  soft-tissue]
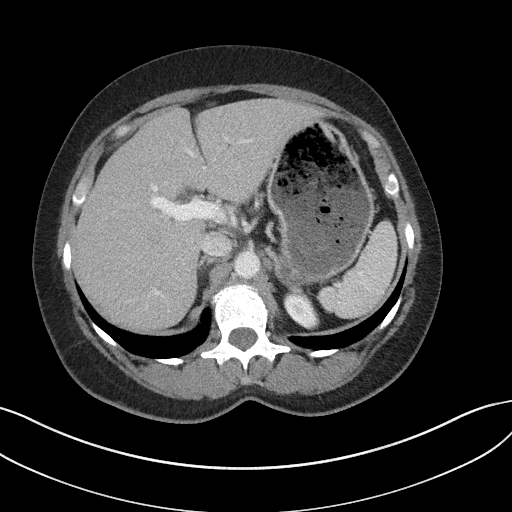
[im 78/88  soft-tissue]
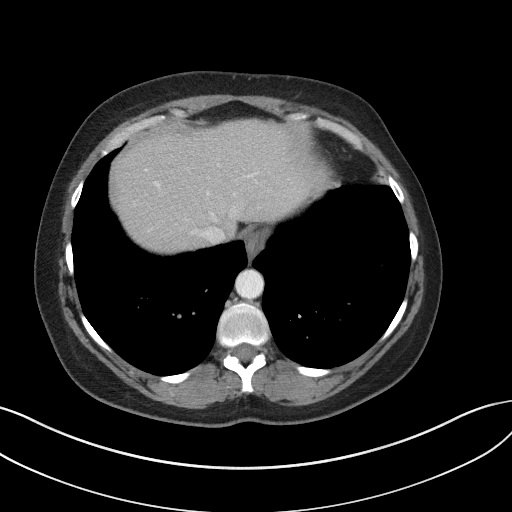
[im 83/88  soft-tissue]
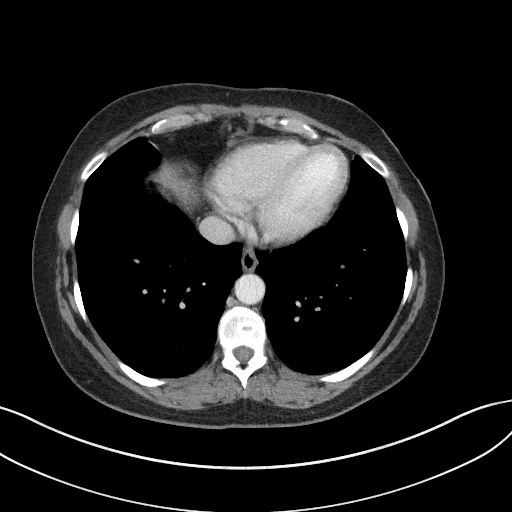

[Series 5: coronal st · coronal · 0.82mm/px · 3 of 102 slices shown]
[im 34/102  soft-tissue]
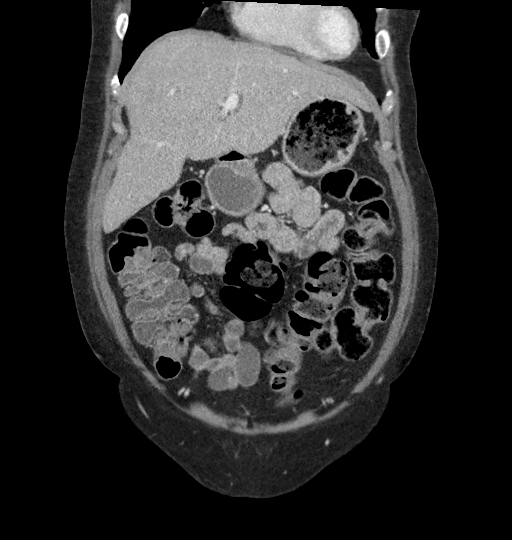
[im 45/102  soft-tissue]
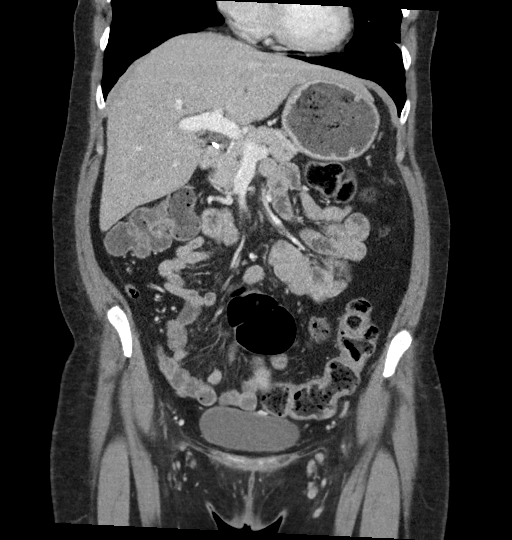
[im 57/102  soft-tissue]
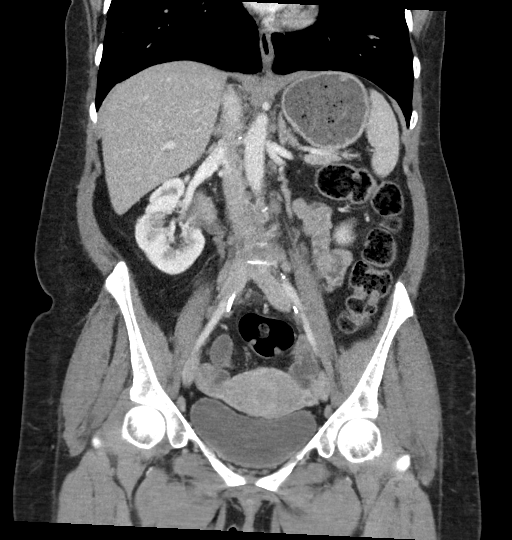

[17 of 46 positions shown; findings below may reference images not displayed]

FINDINGS: Lower chest: No acute abnormality.  Mild centrilobular emphysema.

Hepatobiliary: No focal liver abnormality. Status post
cholecystectomy. Unchanged mild common bile duct and central
intrahepatic biliary dilatation, likely due to post cholecystectomy
state.

Pancreas: Unremarkable. Mildly prominent main pancreatic duct is
unchanged. No surrounding inflammatory changes.

Spleen: Normal in size without focal abnormality.

Adrenals/Urinary Tract: The adrenal glands are unremarkable.
Unchanged right renal cortical scarring. No renal mass or calculus.
No hydronephrosis. The bladder is unremarkable.

Stomach/Bowel: Stomach is within normal limits. Appendix appears
normal. No evidence of bowel wall thickening, distention, or
inflammatory changes. Mild-to-moderate increased colonic stool
burden.

Vascular/Lymphatic: Aortic atherosclerosis. No enlarged abdominal or
pelvic lymph nodes.

Reproductive: Uterus and bilateral adnexa are unremarkable.
Nabothian cyst.

Other: No abdominal wall hernia or abnormality. No abdominopelvic
ascites. No pneumoperitoneum.

Musculoskeletal: No acute or significant osseous findings.
IMPRESSION: 1.  No acute intra-abdominal process.
2.  Prominent stool throughout the colon favors constipation.
3.  Emphysema ([id number].K).
4.  Aortic atherosclerosis ([id number]).

## 2019-07-15 ENCOUNTER — Ambulatory Visit: Payer: Medicaid Other | Attending: Internal Medicine

## 2019-07-15 DIAGNOSIS — Z23 Encounter for immunization: Secondary | ICD-10-CM

## 2019-07-15 NOTE — Progress Notes (Signed)
   Covid-19 Vaccination Clinic  Name:  Amy Richmond    MRN: [id number] DOB: October 01, 1970  07/15/2019  Amy Richmond was observed post Covid-19 immunization for 15 minutes without incident. She was provided with Vaccine Information Sheet and instruction to access the V-Safe system.   Amy Richmond was instructed to call 911 with any severe reactions post vaccine: Marland Kitchen Difficulty breathing  . Swelling of face and throat  . A fast heartbeat  . A bad rash all over body  . Dizziness and weakness   Immunizations Administered    Name Date Dose VIS Date Route   Pfizer COVID-19 Vaccine 07/15/2019  2:42 PM 0.3 mL 03/30/2019 Intramuscular   Manufacturer: [employer], [employer]   Lot: [id number]   NDC: [address]-0

## 2019-08-07 ENCOUNTER — Ambulatory Visit: Payer: Medicaid Other | Attending: Internal Medicine

## 2019-08-07 DIAGNOSIS — Z23 Encounter for immunization: Secondary | ICD-10-CM

## 2019-08-07 NOTE — Progress Notes (Signed)
   Covid-19 Vaccination Clinic  Name:  Amy Richmond    MRN: [id number] DOB: 02-03-1971  08/07/2019  Amy Richmond was observed post Covid-19 immunization for 15 minutes without incident. She was provided with Vaccine Information Sheet and instruction to access the V-Safe system.   Amy Richmond was instructed to call 911 with any severe reactions post vaccine: Marland Kitchen Difficulty breathing  . Swelling of face and throat  . A fast heartbeat  . A bad rash all over body  . Dizziness and weakness   Immunizations Administered    Name Date Dose VIS Date Route   Pfizer COVID-19 Vaccine 08/07/2019  2:27 PM 0.3 mL 06/13/2018 Intramuscular   Manufacturer: [employer], [employer]   Lot: [id number]   NDC: [address]-8

## 2020-01-01 ENCOUNTER — Ambulatory Visit: Payer: Medicaid Other | Admitting: Podiatry

## 2020-01-10 DIAGNOSIS — F32A Depression, unspecified: Secondary | ICD-10-CM | POA: Insufficient documentation

## 2020-01-10 DIAGNOSIS — G8929 Other chronic pain: Secondary | ICD-10-CM | POA: Insufficient documentation

## 2020-01-10 DIAGNOSIS — G4700 Insomnia, unspecified: Secondary | ICD-10-CM | POA: Insufficient documentation

## 2020-01-10 DIAGNOSIS — K861 Other chronic pancreatitis: Secondary | ICD-10-CM | POA: Insufficient documentation

## 2020-01-10 DIAGNOSIS — F1021 Alcohol dependence, in remission: Secondary | ICD-10-CM | POA: Insufficient documentation

## 2020-01-11 ENCOUNTER — Encounter: Payer: Self-pay | Admitting: Podiatry

## 2020-01-11 ENCOUNTER — Other Ambulatory Visit: Payer: Self-pay

## 2020-01-11 ENCOUNTER — Ambulatory Visit (INDEPENDENT_AMBULATORY_CARE_PROVIDER_SITE_OTHER): Payer: Medicaid Other | Admitting: Podiatry

## 2020-01-11 DIAGNOSIS — B351 Tinea unguium: Secondary | ICD-10-CM | POA: Diagnosis not present

## 2020-01-11 DIAGNOSIS — B353 Tinea pedis: Secondary | ICD-10-CM

## 2020-01-11 MED ORDER — CLOTRIMAZOLE-BETAMETHASONE 1-0.05 % EX CREA: 1.0000 "application " | TOPICAL_CREAM | Freq: Two times a day (BID) | CUTANEOUS | 1 refills | Status: AC

## 2020-01-11 NOTE — Progress Notes (Signed)
   HPI: 49 y.o. female presenting today as a new patient with complaint of bilateral itching and dry skin to the bilateral feet.  She also complains of thickened dystrophic nails to the bilateral feet.  She is concerned for possible toenail fungus.  She does have history of chronic liver cirrhosis.  Today she also complains of multiple comorbidities and issues regarding multiple systems within the body.  She presents for further treatment evaluation  Past Medical History:  Diagnosis Date  . Cirrhosis of liver (HCC)   . Fibromyalgia      Physical Exam: General: The patient is alert and oriented x3 in no acute distress.  Dermatology: Skin is warm, dry and supple bilateral lower extremities. Negative for open lesions or macerations.  There is some hyperkeratosis of the nails bilateral with some discoloration.  Xerosis of the skin also noted to the weightbearing surfaces of the feet with pruritus.  Vascular: Palpable pedal pulses bilaterally. No edema or erythema noted. Capillary refill within normal limits.  Neurological: Epicritic and protective threshold grossly intact bilaterally.   Musculoskeletal Exam: Range of motion within normal limits to all pedal and ankle joints bilateral. Muscle strength 5/5 in all groups bilateral.    Assessment: 1.  Tinea pedis bilateral 2.  Onychomycosis of toenails bilateral   Plan of Care:  1. Patient evaluated.  2.  No oral medication prescribed today 3.  Prescription for Lotrisone cream apply 2 times daily 4.  OTC Tolcylen antifungal topical provided to apply daily to the nails 5.  Return to clinic as needed      Felecia Shelling, DPM [facility]  Dr. Felecia Shelling, DPM    [address] [place], [place] [address]                Office [phone]  Fax [phone]

## 2020-02-08 ENCOUNTER — Ambulatory Visit: Payer: Medicaid Other | Admitting: Podiatry

## 2020-03-17 ENCOUNTER — Ambulatory Visit: Payer: Medicaid Other | Admitting: Family Medicine

## 2020-03-17 NOTE — Progress Notes (Deleted)
New patient visit   Patient: Amy Richmond   DOB: [birth year]   49 y.o. Female  MRN: [id number] Visit Date: 03/17/2020  Today's healthcare provider: Dortha Kern, PA   No chief complaint on file.  Subjective    Amy Richmond is a 49 y.o. female who presents today as a new patient to establish care.  HPI  ***  Past Medical History:  Diagnosis Date  . Cirrhosis of liver (HCC)   . Fibromyalgia    Past Surgical History:  Procedure Laterality Date  . TUBAL LIGATION     Family Status  Relation Name Status  . Mother  (Not Specified)  . MGM  (Not Specified)   Family History  Problem Relation Age of Onset  . Breast cancer Mother 27       passed at 56 with mets  . Breast cancer Maternal Grandmother    Social History   Socioeconomic History  . Marital status: Single    Spouse name: Not on file  . Number of children: Not on file  . Years of education: Not on file  . Highest education level: Not on file  Occupational History  . Not on file  Tobacco Use  . Smoking status: Current Every Day Smoker    Packs/day: 0.50    Types: Cigarettes  . Smokeless tobacco: Never Used  Substance and Sexual Activity  . Alcohol use: Not Currently  . Drug use: Never  . Sexual activity: Not on file  Other Topics Concern  . Not on file  Social History Narrative  . Not on file   Social Determinants of Health   Financial Resource Strain:   . Difficulty of Paying Living Expenses: Not on file  Food Insecurity:   . Worried About Programme researcher, broadcasting/film/video in the Last Year: Not on file  . Ran Out of Food in the Last Year: Not on file  Transportation Needs:   . Lack of Transportation (Medical): Not on file  . Lack of Transportation (Non-Medical): Not on file  Physical Activity:   . Days of Exercise per Week: Not on file  . Minutes of Exercise per Session: Not on file  Stress:   . Feeling of Stress : Not on file  Social Connections:   . Frequency of Communication with Friends and  Family: Not on file  . Frequency of Social Gatherings with Friends and Family: Not on file  . Attends Religious Services: Not on file  . Active Member of Clubs or Organizations: Not on file  . Attends Banker Meetings: Not on file  . Marital Status: Not on file   Outpatient Medications Prior to Visit  Medication Sig  . ADDERALL XR 20 MG 24 hr capsule Take 20 mg by mouth daily.  Marland Kitchen amphetamine-dextroamphetamine (ADDERALL) 20 MG tablet Take 20 mg by mouth daily at 2 PM.  . atomoxetine (STRATTERA) 40 MG capsule TK ONE C PO BID  . buprenorphine-naloxone (SUBOXONE) 8-2 mg SUBL SL tablet Place 1 tablet under the tongue 3 (three) times daily.  . clotrimazole-betamethasone (LOTRISONE) cream Apply 1 application topically 2 (two) times daily.  . DULoxetine (CYMBALTA) 30 MG capsule Take 60 mg by mouth daily.  Marland Kitchen esomeprazole (NEXIUM) 40 MG capsule Take by mouth.  . fluticasone (FLONASE) 50 MCG/ACT nasal spray Place 2 sprays into both nostrils daily.  Marland Kitchen gabapentin (NEURONTIN) 800 MG tablet Take 800 mg by mouth 4 (four) times daily.  . hydrOXYzine (ATARAX/VISTARIL) 25 MG tablet TK  1 TO 2 TS PO HS FOR SLP  . ketoconazole (NIZORAL) 2 % shampoo Apply topically.  . mirtazapine (REMERON) 30 MG tablet Take 30 mg by mouth at bedtime.  . mupirocin ointment (BACTROBAN) 2 % APP TOPICALLY AA TID FOR 5 DAYS  . naloxone (NARCAN) nasal spray 4 mg/0.1 mL If patient has not started breathing, may repeat spray in 2-3 min  . PROAIR HFA 108 (90 Base) MCG/ACT inhaler Inhale 2 puffs into the lungs every 6 (six) hours as needed for wheezing.  . promethazine (PHENERGAN) 25 MG tablet Take 25 mg by mouth every 6 (six) hours as needed for nausea.   Marland Kitchen PROTOPIC 0.03 % ointment Apply topically 2 (two) times daily.  . QUEtiapine (SEROQUEL) 25 MG tablet Take 25 mg by mouth at bedtime.  . SUBOXONE 8-2 MG FILM Place 3 Film under the tongue daily.  Marland Kitchen topiramate (TOPAMAX) 100 MG tablet Take 200 mg by mouth 2 (two) times  daily.  . Vitamin D, Ergocalciferol, (DRISDOL) 1.25 MG (50000 UNIT) CAPS capsule Take 50,000 Units by mouth once a week.   No facility-administered medications prior to visit.   Allergies  Allergen Reactions  . Tylenol [Acetaminophen] Other (See Comments)    History of cirrhosis    Immunization History  Administered Date(s) Administered  . Hep A / Hep B 02/05/2010  . Hepatitis B, adult 03/01/2011  . Influenza, Seasonal, Injecte, Preservative Fre 03/16/2010, 03/01/2011  . PFIZER SARS-COV-2 Vaccination 07/15/2019, 08/07/2019    Health Maintenance  Topic Date Due  . Hepatitis C Screening  Never done  . HIV Screening  Never done  . TETANUS/TDAP  Never done  . PAP SMEAR-Modifier  Never done  . INFLUENZA VACCINE  11/18/2019  . COVID-19 Vaccine  Completed    Patient Care Team: [place] as PCP - General (Family Medicine)  Review of Systems  {Heme  Chem  Endocrine  Serology  Results Review (optional):[id number]::" "}  Objective    There were no vitals taken for this visit. Physical Exam ***  Depression Screen No flowsheet data found. No results found for any visits on 03/17/20.  Assessment & Plan     ***  No follow-ups on file.     {provider attestation***:1}   Dortha Kern, PA  [facility] [phone] (phone) [phone] (fax)  [facility]

## 2020-03-28 ENCOUNTER — Ambulatory Visit: Payer: Medicaid Other | Admitting: Family Medicine

## 2020-04-22 ENCOUNTER — Ambulatory Visit (INDEPENDENT_AMBULATORY_CARE_PROVIDER_SITE_OTHER): Payer: Medicaid Other | Admitting: Adult Health

## 2020-04-22 DIAGNOSIS — Z5329 Procedure and treatment not carried out because of patient's decision for other reasons: Secondary | ICD-10-CM

## 2020-04-22 NOTE — Progress Notes (Signed)
No show appointment  

## 2020-04-22 NOTE — Patient Instructions (Signed)
No show

## 2022-06-03 DIAGNOSIS — F1120 Opioid dependence, uncomplicated: Secondary | ICD-10-CM | POA: Diagnosis not present

## 2022-06-10 DIAGNOSIS — F1120 Opioid dependence, uncomplicated: Secondary | ICD-10-CM | POA: Diagnosis not present

## 2022-06-17 DIAGNOSIS — F1120 Opioid dependence, uncomplicated: Secondary | ICD-10-CM | POA: Diagnosis not present

## 2022-06-25 DIAGNOSIS — F1120 Opioid dependence, uncomplicated: Secondary | ICD-10-CM | POA: Diagnosis not present

## 2022-07-02 DIAGNOSIS — F1120 Opioid dependence, uncomplicated: Secondary | ICD-10-CM | POA: Diagnosis not present

## 2022-07-09 DIAGNOSIS — F1120 Opioid dependence, uncomplicated: Secondary | ICD-10-CM | POA: Diagnosis not present

## 2022-07-16 DIAGNOSIS — F1120 Opioid dependence, uncomplicated: Secondary | ICD-10-CM | POA: Diagnosis not present

## 2022-07-23 DIAGNOSIS — F1120 Opioid dependence, uncomplicated: Secondary | ICD-10-CM | POA: Diagnosis not present

## 2022-07-30 DIAGNOSIS — F1120 Opioid dependence, uncomplicated: Secondary | ICD-10-CM | POA: Diagnosis not present

## 2022-08-03 DIAGNOSIS — F1011 Alcohol abuse, in remission: Secondary | ICD-10-CM | POA: Diagnosis not present

## 2022-08-03 DIAGNOSIS — F1120 Opioid dependence, uncomplicated: Secondary | ICD-10-CM | POA: Diagnosis not present

## 2022-08-03 DIAGNOSIS — F909 Attention-deficit hyperactivity disorder, unspecified type: Secondary | ICD-10-CM | POA: Diagnosis not present

## 2022-08-03 DIAGNOSIS — F3181 Bipolar II disorder: Secondary | ICD-10-CM | POA: Diagnosis not present

## 2022-08-10 DIAGNOSIS — F1120 Opioid dependence, uncomplicated: Secondary | ICD-10-CM | POA: Diagnosis not present

## 2022-08-10 DIAGNOSIS — F909 Attention-deficit hyperactivity disorder, unspecified type: Secondary | ICD-10-CM | POA: Diagnosis not present

## 2022-08-10 DIAGNOSIS — F1011 Alcohol abuse, in remission: Secondary | ICD-10-CM | POA: Diagnosis not present

## 2022-08-10 DIAGNOSIS — F3181 Bipolar II disorder: Secondary | ICD-10-CM | POA: Diagnosis not present

## 2022-08-13 DIAGNOSIS — F1120 Opioid dependence, uncomplicated: Secondary | ICD-10-CM | POA: Diagnosis not present

## 2022-08-17 DIAGNOSIS — F1120 Opioid dependence, uncomplicated: Secondary | ICD-10-CM | POA: Diagnosis not present

## 2022-08-17 DIAGNOSIS — F909 Attention-deficit hyperactivity disorder, unspecified type: Secondary | ICD-10-CM | POA: Diagnosis not present

## 2022-08-17 DIAGNOSIS — F3181 Bipolar II disorder: Secondary | ICD-10-CM | POA: Diagnosis not present

## 2022-08-17 DIAGNOSIS — F1011 Alcohol abuse, in remission: Secondary | ICD-10-CM | POA: Diagnosis not present

## 2022-08-19 DIAGNOSIS — F909 Attention-deficit hyperactivity disorder, unspecified type: Secondary | ICD-10-CM | POA: Diagnosis not present

## 2022-08-19 DIAGNOSIS — F3181 Bipolar II disorder: Secondary | ICD-10-CM | POA: Diagnosis not present

## 2022-08-19 DIAGNOSIS — F1120 Opioid dependence, uncomplicated: Secondary | ICD-10-CM | POA: Diagnosis not present

## 2022-08-19 DIAGNOSIS — F1011 Alcohol abuse, in remission: Secondary | ICD-10-CM | POA: Diagnosis not present

## 2022-08-20 DIAGNOSIS — F1120 Opioid dependence, uncomplicated: Secondary | ICD-10-CM | POA: Diagnosis not present

## 2022-08-23 DIAGNOSIS — F1120 Opioid dependence, uncomplicated: Secondary | ICD-10-CM | POA: Diagnosis not present

## 2022-08-23 DIAGNOSIS — F3181 Bipolar II disorder: Secondary | ICD-10-CM | POA: Diagnosis not present

## 2022-08-23 DIAGNOSIS — F909 Attention-deficit hyperactivity disorder, unspecified type: Secondary | ICD-10-CM | POA: Diagnosis not present

## 2022-08-23 DIAGNOSIS — F1011 Alcohol abuse, in remission: Secondary | ICD-10-CM | POA: Diagnosis not present

## 2022-08-24 DIAGNOSIS — F909 Attention-deficit hyperactivity disorder, unspecified type: Secondary | ICD-10-CM | POA: Diagnosis not present

## 2022-08-24 DIAGNOSIS — F1120 Opioid dependence, uncomplicated: Secondary | ICD-10-CM | POA: Diagnosis not present

## 2022-08-24 DIAGNOSIS — F1011 Alcohol abuse, in remission: Secondary | ICD-10-CM | POA: Diagnosis not present

## 2022-08-24 DIAGNOSIS — F3181 Bipolar II disorder: Secondary | ICD-10-CM | POA: Diagnosis not present

## 2022-08-27 DIAGNOSIS — F1120 Opioid dependence, uncomplicated: Secondary | ICD-10-CM | POA: Diagnosis not present

## 2022-08-31 DIAGNOSIS — F3181 Bipolar II disorder: Secondary | ICD-10-CM | POA: Diagnosis not present

## 2022-08-31 DIAGNOSIS — F1120 Opioid dependence, uncomplicated: Secondary | ICD-10-CM | POA: Diagnosis not present

## 2022-08-31 DIAGNOSIS — F1011 Alcohol abuse, in remission: Secondary | ICD-10-CM | POA: Diagnosis not present

## 2022-08-31 DIAGNOSIS — F909 Attention-deficit hyperactivity disorder, unspecified type: Secondary | ICD-10-CM | POA: Diagnosis not present

## 2022-09-03 DIAGNOSIS — F1120 Opioid dependence, uncomplicated: Secondary | ICD-10-CM | POA: Diagnosis not present

## 2022-09-07 DIAGNOSIS — F909 Attention-deficit hyperactivity disorder, unspecified type: Secondary | ICD-10-CM | POA: Diagnosis not present

## 2022-09-07 DIAGNOSIS — F1120 Opioid dependence, uncomplicated: Secondary | ICD-10-CM | POA: Diagnosis not present

## 2022-09-07 DIAGNOSIS — F3181 Bipolar II disorder: Secondary | ICD-10-CM | POA: Diagnosis not present

## 2022-09-07 DIAGNOSIS — F1011 Alcohol abuse, in remission: Secondary | ICD-10-CM | POA: Diagnosis not present

## 2022-09-10 DIAGNOSIS — F1120 Opioid dependence, uncomplicated: Secondary | ICD-10-CM | POA: Diagnosis not present

## 2022-09-14 DIAGNOSIS — F3181 Bipolar II disorder: Secondary | ICD-10-CM | POA: Diagnosis not present

## 2022-09-14 DIAGNOSIS — F1011 Alcohol abuse, in remission: Secondary | ICD-10-CM | POA: Diagnosis not present

## 2022-09-14 DIAGNOSIS — F1120 Opioid dependence, uncomplicated: Secondary | ICD-10-CM | POA: Diagnosis not present

## 2022-09-14 DIAGNOSIS — F909 Attention-deficit hyperactivity disorder, unspecified type: Secondary | ICD-10-CM | POA: Diagnosis not present

## 2022-09-16 DIAGNOSIS — F1011 Alcohol abuse, in remission: Secondary | ICD-10-CM | POA: Diagnosis not present

## 2022-09-16 DIAGNOSIS — F1120 Opioid dependence, uncomplicated: Secondary | ICD-10-CM | POA: Diagnosis not present

## 2022-09-16 DIAGNOSIS — F909 Attention-deficit hyperactivity disorder, unspecified type: Secondary | ICD-10-CM | POA: Diagnosis not present

## 2022-09-16 DIAGNOSIS — F3181 Bipolar II disorder: Secondary | ICD-10-CM | POA: Diagnosis not present

## 2022-09-17 DIAGNOSIS — Z79891 Long term (current) use of opiate analgesic: Secondary | ICD-10-CM | POA: Diagnosis not present

## 2022-09-17 DIAGNOSIS — F11220 Opioid dependence with intoxication, uncomplicated: Secondary | ICD-10-CM | POA: Diagnosis not present

## 2022-09-21 DIAGNOSIS — F909 Attention-deficit hyperactivity disorder, unspecified type: Secondary | ICD-10-CM | POA: Diagnosis not present

## 2022-09-21 DIAGNOSIS — F3181 Bipolar II disorder: Secondary | ICD-10-CM | POA: Diagnosis not present

## 2022-09-21 DIAGNOSIS — F1011 Alcohol abuse, in remission: Secondary | ICD-10-CM | POA: Diagnosis not present

## 2022-09-21 DIAGNOSIS — F1120 Opioid dependence, uncomplicated: Secondary | ICD-10-CM | POA: Diagnosis not present

## 2022-09-22 DIAGNOSIS — F1120 Opioid dependence, uncomplicated: Secondary | ICD-10-CM | POA: Diagnosis not present

## 2022-09-28 DIAGNOSIS — F1011 Alcohol abuse, in remission: Secondary | ICD-10-CM | POA: Diagnosis not present

## 2022-09-28 DIAGNOSIS — F1120 Opioid dependence, uncomplicated: Secondary | ICD-10-CM | POA: Diagnosis not present

## 2022-09-28 DIAGNOSIS — F909 Attention-deficit hyperactivity disorder, unspecified type: Secondary | ICD-10-CM | POA: Diagnosis not present

## 2022-09-28 DIAGNOSIS — F3181 Bipolar II disorder: Secondary | ICD-10-CM | POA: Diagnosis not present

## 2022-10-01 DIAGNOSIS — F1120 Opioid dependence, uncomplicated: Secondary | ICD-10-CM | POA: Diagnosis not present

## 2022-10-05 DIAGNOSIS — F909 Attention-deficit hyperactivity disorder, unspecified type: Secondary | ICD-10-CM | POA: Diagnosis not present

## 2022-10-05 DIAGNOSIS — F1011 Alcohol abuse, in remission: Secondary | ICD-10-CM | POA: Diagnosis not present

## 2022-10-05 DIAGNOSIS — F3181 Bipolar II disorder: Secondary | ICD-10-CM | POA: Diagnosis not present

## 2022-10-05 DIAGNOSIS — F1120 Opioid dependence, uncomplicated: Secondary | ICD-10-CM | POA: Diagnosis not present

## 2022-10-06 DIAGNOSIS — F3181 Bipolar II disorder: Secondary | ICD-10-CM | POA: Diagnosis not present

## 2022-10-06 DIAGNOSIS — F1120 Opioid dependence, uncomplicated: Secondary | ICD-10-CM | POA: Diagnosis not present

## 2022-10-06 DIAGNOSIS — F909 Attention-deficit hyperactivity disorder, unspecified type: Secondary | ICD-10-CM | POA: Diagnosis not present

## 2022-10-06 DIAGNOSIS — F1011 Alcohol abuse, in remission: Secondary | ICD-10-CM | POA: Diagnosis not present

## 2022-10-08 DIAGNOSIS — F11220 Opioid dependence with intoxication, uncomplicated: Secondary | ICD-10-CM | POA: Diagnosis not present

## 2022-10-12 DIAGNOSIS — F1011 Alcohol abuse, in remission: Secondary | ICD-10-CM | POA: Diagnosis not present

## 2022-10-12 DIAGNOSIS — F909 Attention-deficit hyperactivity disorder, unspecified type: Secondary | ICD-10-CM | POA: Diagnosis not present

## 2022-10-12 DIAGNOSIS — F1120 Opioid dependence, uncomplicated: Secondary | ICD-10-CM | POA: Diagnosis not present

## 2022-10-12 DIAGNOSIS — F3181 Bipolar II disorder: Secondary | ICD-10-CM | POA: Diagnosis not present

## 2022-10-13 DIAGNOSIS — F1120 Opioid dependence, uncomplicated: Secondary | ICD-10-CM | POA: Diagnosis not present

## 2022-10-18 DIAGNOSIS — F909 Attention-deficit hyperactivity disorder, unspecified type: Secondary | ICD-10-CM | POA: Diagnosis not present

## 2022-10-18 DIAGNOSIS — F1120 Opioid dependence, uncomplicated: Secondary | ICD-10-CM | POA: Diagnosis not present

## 2022-10-18 DIAGNOSIS — F1011 Alcohol abuse, in remission: Secondary | ICD-10-CM | POA: Diagnosis not present

## 2022-10-18 DIAGNOSIS — F3181 Bipolar II disorder: Secondary | ICD-10-CM | POA: Diagnosis not present

## 2022-10-19 DIAGNOSIS — F3181 Bipolar II disorder: Secondary | ICD-10-CM | POA: Diagnosis not present

## 2022-10-19 DIAGNOSIS — F1120 Opioid dependence, uncomplicated: Secondary | ICD-10-CM | POA: Diagnosis not present

## 2022-10-19 DIAGNOSIS — F909 Attention-deficit hyperactivity disorder, unspecified type: Secondary | ICD-10-CM | POA: Diagnosis not present

## 2022-10-19 DIAGNOSIS — F1011 Alcohol abuse, in remission: Secondary | ICD-10-CM | POA: Diagnosis not present

## 2022-10-20 DIAGNOSIS — F1011 Alcohol abuse, in remission: Secondary | ICD-10-CM | POA: Diagnosis not present

## 2022-10-20 DIAGNOSIS — F3181 Bipolar II disorder: Secondary | ICD-10-CM | POA: Diagnosis not present

## 2022-10-20 DIAGNOSIS — F1120 Opioid dependence, uncomplicated: Secondary | ICD-10-CM | POA: Diagnosis not present

## 2022-10-20 DIAGNOSIS — F909 Attention-deficit hyperactivity disorder, unspecified type: Secondary | ICD-10-CM | POA: Diagnosis not present

## 2022-10-26 DIAGNOSIS — F1011 Alcohol abuse, in remission: Secondary | ICD-10-CM | POA: Diagnosis not present

## 2022-10-26 DIAGNOSIS — F909 Attention-deficit hyperactivity disorder, unspecified type: Secondary | ICD-10-CM | POA: Diagnosis not present

## 2022-10-26 DIAGNOSIS — F1120 Opioid dependence, uncomplicated: Secondary | ICD-10-CM | POA: Diagnosis not present

## 2022-10-26 DIAGNOSIS — F3181 Bipolar II disorder: Secondary | ICD-10-CM | POA: Diagnosis not present

## 2022-10-27 DIAGNOSIS — F17201 Nicotine dependence, unspecified, in remission: Secondary | ICD-10-CM | POA: Diagnosis not present

## 2022-10-27 DIAGNOSIS — F1421 Cocaine dependence, in remission: Secondary | ICD-10-CM | POA: Diagnosis not present

## 2022-10-27 DIAGNOSIS — F39 Unspecified mood [affective] disorder: Secondary | ICD-10-CM | POA: Diagnosis not present

## 2022-10-27 DIAGNOSIS — Z79899 Other long term (current) drug therapy: Secondary | ICD-10-CM | POA: Diagnosis not present

## 2022-11-02 DIAGNOSIS — F909 Attention-deficit hyperactivity disorder, unspecified type: Secondary | ICD-10-CM | POA: Diagnosis not present

## 2022-11-02 DIAGNOSIS — F3181 Bipolar II disorder: Secondary | ICD-10-CM | POA: Diagnosis not present

## 2022-11-02 DIAGNOSIS — F1120 Opioid dependence, uncomplicated: Secondary | ICD-10-CM | POA: Diagnosis not present

## 2022-11-02 DIAGNOSIS — F1011 Alcohol abuse, in remission: Secondary | ICD-10-CM | POA: Diagnosis not present

## 2022-11-03 DIAGNOSIS — F1120 Opioid dependence, uncomplicated: Secondary | ICD-10-CM | POA: Diagnosis not present

## 2022-11-03 DIAGNOSIS — F3181 Bipolar II disorder: Secondary | ICD-10-CM | POA: Diagnosis not present

## 2022-11-03 DIAGNOSIS — F39 Unspecified mood [affective] disorder: Secondary | ICD-10-CM | POA: Diagnosis not present

## 2022-11-03 DIAGNOSIS — F1011 Alcohol abuse, in remission: Secondary | ICD-10-CM | POA: Diagnosis not present

## 2022-11-09 DIAGNOSIS — F3181 Bipolar II disorder: Secondary | ICD-10-CM | POA: Diagnosis not present

## 2022-11-09 DIAGNOSIS — F909 Attention-deficit hyperactivity disorder, unspecified type: Secondary | ICD-10-CM | POA: Diagnosis not present

## 2022-11-09 DIAGNOSIS — F1011 Alcohol abuse, in remission: Secondary | ICD-10-CM | POA: Diagnosis not present

## 2022-11-09 DIAGNOSIS — F1120 Opioid dependence, uncomplicated: Secondary | ICD-10-CM | POA: Diagnosis not present

## 2022-11-10 DIAGNOSIS — F3181 Bipolar II disorder: Secondary | ICD-10-CM | POA: Diagnosis not present

## 2022-11-10 DIAGNOSIS — F1120 Opioid dependence, uncomplicated: Secondary | ICD-10-CM | POA: Diagnosis not present

## 2022-11-10 DIAGNOSIS — F909 Attention-deficit hyperactivity disorder, unspecified type: Secondary | ICD-10-CM | POA: Diagnosis not present

## 2022-11-10 DIAGNOSIS — F1011 Alcohol abuse, in remission: Secondary | ICD-10-CM | POA: Diagnosis not present

## 2022-11-16 DIAGNOSIS — F909 Attention-deficit hyperactivity disorder, unspecified type: Secondary | ICD-10-CM | POA: Diagnosis not present

## 2022-11-16 DIAGNOSIS — F1120 Opioid dependence, uncomplicated: Secondary | ICD-10-CM | POA: Diagnosis not present

## 2022-11-16 DIAGNOSIS — F1011 Alcohol abuse, in remission: Secondary | ICD-10-CM | POA: Diagnosis not present

## 2022-11-16 DIAGNOSIS — F3181 Bipolar II disorder: Secondary | ICD-10-CM | POA: Diagnosis not present

## 2022-11-17 DIAGNOSIS — F1120 Opioid dependence, uncomplicated: Secondary | ICD-10-CM | POA: Diagnosis not present

## 2022-11-17 DIAGNOSIS — F1011 Alcohol abuse, in remission: Secondary | ICD-10-CM | POA: Diagnosis not present

## 2022-11-17 DIAGNOSIS — F909 Attention-deficit hyperactivity disorder, unspecified type: Secondary | ICD-10-CM | POA: Diagnosis not present

## 2022-11-17 DIAGNOSIS — F3181 Bipolar II disorder: Secondary | ICD-10-CM | POA: Diagnosis not present
